# Patient Record
Sex: Male | Born: 1985 | Race: White | Hispanic: No | Marital: Single
Health system: Southern US, Community
[De-identification: ages and names within clinical notes are randomized; demographics above are authoritative.]

---

## 2004-12-03 ENCOUNTER — Emergency Department: Payer: Self-pay | Admitting: Emergency Medicine

## 2005-05-26 ENCOUNTER — Other Ambulatory Visit: Payer: Self-pay

## 2005-05-26 ENCOUNTER — Emergency Department: Payer: Self-pay | Admitting: Emergency Medicine

## 2006-03-04 ENCOUNTER — Emergency Department: Payer: Self-pay | Admitting: Emergency Medicine

## 2006-04-03 ENCOUNTER — Emergency Department: Payer: Self-pay | Admitting: Emergency Medicine

## 2006-04-15 ENCOUNTER — Emergency Department: Payer: Self-pay | Admitting: Emergency Medicine

## 2006-04-25 ENCOUNTER — Emergency Department: Payer: Self-pay

## 2007-07-19 ENCOUNTER — Emergency Department: Payer: Self-pay | Admitting: Emergency Medicine

## 2007-09-06 ENCOUNTER — Emergency Department: Payer: Self-pay | Admitting: Emergency Medicine

## 2008-07-06 ENCOUNTER — Emergency Department: Payer: Self-pay | Admitting: Unknown Physician Specialty

## 2009-02-28 ENCOUNTER — Emergency Department: Payer: Self-pay | Admitting: Emergency Medicine

## 2010-06-27 ENCOUNTER — Emergency Department (HOSPITAL_COMMUNITY)
Admission: EM | Admit: 2010-06-27 | Discharge: 2010-06-27 | Payer: Self-pay | Source: Home / Self Care | Admitting: Emergency Medicine

## 2010-11-11 ENCOUNTER — Emergency Department: Payer: Self-pay | Admitting: Emergency Medicine
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
## 2012-06-07 ENCOUNTER — Emergency Department: Payer: Self-pay | Admitting: Emergency Medicine

## 2012-06-07 LAB — CBC
HGB: 16.4 g/dL (ref 13.0–18.0)
Platelet: 249 10*3/uL (ref 150–440)
RBC: 4.95 10*6/uL (ref 4.40–5.90)
WBC: 6.4 10*3/uL (ref 3.8–10.6)

## 2012-06-07 LAB — DRUG SCREEN, URINE
Amphetamines, Ur Screen: NEGATIVE (ref ?–1000)
Barbiturates, Ur Screen: NEGATIVE (ref ?–200)
Benzodiazepine, Ur Scrn: NEGATIVE (ref ?–200)
Methadone, Ur Screen: NEGATIVE (ref ?–300)
Opiate, Ur Screen: NEGATIVE (ref ?–300)
Tricyclic, Ur Screen: NEGATIVE (ref ?–1000)

## 2012-06-07 LAB — ACETAMINOPHEN LEVEL: Acetaminophen: <2 ug/mL

## 2012-06-07 LAB — URINALYSIS, COMPLETE
Blood: NEGATIVE
Glucose,UR: NEGATIVE mg/dL (ref 0–75)
Leukocyte Esterase: NEGATIVE
Nitrite: NEGATIVE
Ph: 5 (ref 4.5–8.0)
Squamous Epithelial: <1

## 2012-06-07 LAB — COMPREHENSIVE METABOLIC PANEL
Calcium, Total: 8.6 mg/dL (ref 8.5–10.1)
Co2: 26 mmol/L (ref 21–32)
EGFR (African American): >60
EGFR (Non-African Amer.): >60
Glucose: 93 mg/dL (ref 65–99)
SGOT(AST): 28 U/L (ref 15–37)
SGPT (ALT): 42 U/L (ref 12–78)

## 2012-06-07 LAB — ETHANOL: Ethanol %: 0.305 % (ref 0.000–0.080)

## 2012-12-04 ENCOUNTER — Ambulatory Visit: Payer: Self-pay | Admitting: Family Medicine

## 2012-12-11 ENCOUNTER — Ambulatory Visit: Payer: Self-pay | Admitting: Family Medicine
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
## 2014-12-20 NOTE — Consult Note (Signed)
Brief Consult Note: Diagnosis: Alcohol intoxication, alcohol abuse.   Patient was seen by consultant.   Consult note dictated.   Recommend further assessment or treatment.   Orders entered.   Discussed with Attending MD.   Comments: [name] has no psychiatric history. He got drunk last night and made vague suicidal threats. He is no longer suicidal or homicidal. He is able to contract for safety.  PLAN: 1. The patient no longer meets criteria for IVC. I will terminate proceedings. Please discharge as appropriate.   2. No medications indicated.   3. The patient does not require alcohol detox.   4. The patient declines substance abuse treatment. No follow up appointment scheduled.  5. His GF will pick him up.  Electronic Signatures: Kristine LineaPucilowska, Jolanta (MD)  (Signed 07-Oct-13 14:26)  Authored: Brief Consult Note   Last Updated: 07-Oct-13 14:26 by Kristine LineaPucilowska, Jolanta (MD)
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
## 2014-12-20 NOTE — Consult Note (Signed)
               Electronic Signatures: Kristine LineaPucilowska, Shelda Truby (MD)  (Signed 07-Oct-13 14:27)  Authored: Brief Consult Note   Last Updated: 07-Oct-13 14:27 by Kristine LineaPucilowska, Hubbert Landrigan (MD)
